# Patient Record
Sex: Male | Born: 1985 | Race: White | Hispanic: No | Marital: Married | Smoking: Current every day smoker
Health system: Southern US, Community
[De-identification: ages and names within clinical notes are randomized; demographics above are authoritative.]

---

## 2010-03-03 ENCOUNTER — Emergency Department (HOSPITAL_COMMUNITY): Admission: EM | Admit: 2010-03-03 | Discharge: 2010-03-03 | Payer: Self-pay | Admitting: Emergency Medicine

## 2010-08-14 ENCOUNTER — Emergency Department (HOSPITAL_COMMUNITY)
Admission: EM | Admit: 2010-08-14 | Discharge: 2010-08-14 | Disposition: A | Discharge status: Home / Self Care | Payer: Worker's Compensation | Attending: Emergency Medicine | Admitting: Emergency Medicine

## 2010-08-14 DIAGNOSIS — M79609 Pain in unspecified limb: Secondary | ICD-10-CM | POA: Insufficient documentation

## 2010-08-14 DIAGNOSIS — Y9289 Other specified places as the place of occurrence of the external cause: Secondary | ICD-10-CM | POA: Insufficient documentation

## 2010-08-14 DIAGNOSIS — X58XXXA Exposure to other specified factors, initial encounter: Secondary | ICD-10-CM | POA: Insufficient documentation

## 2010-08-14 DIAGNOSIS — S60559A Superficial foreign body of unspecified hand, initial encounter: Secondary | ICD-10-CM | POA: Insufficient documentation

## 2014-05-05 ENCOUNTER — Emergency Department (HOSPITAL_COMMUNITY): Payer: Worker's Compensation

## 2014-05-05 ENCOUNTER — Emergency Department (HOSPITAL_COMMUNITY)
Admission: EM | Admit: 2014-05-05 | Discharge: 2014-05-05 | Disposition: A | Discharge status: Home / Self Care | Payer: Worker's Compensation | Attending: Emergency Medicine | Admitting: Emergency Medicine

## 2014-05-05 DIAGNOSIS — M79605 Pain in left leg: Secondary | ICD-10-CM

## 2014-05-05 DIAGNOSIS — Y990 Civilian activity done for income or pay: Secondary | ICD-10-CM | POA: Insufficient documentation

## 2014-05-05 DIAGNOSIS — Y9389 Activity, other specified: Secondary | ICD-10-CM | POA: Diagnosis not present

## 2014-05-05 DIAGNOSIS — Y92410 Unspecified street and highway as the place of occurrence of the external cause: Secondary | ICD-10-CM | POA: Diagnosis not present

## 2014-05-05 DIAGNOSIS — S80212A Abrasion, left knee, initial encounter: Secondary | ICD-10-CM | POA: Insufficient documentation

## 2014-05-05 DIAGNOSIS — S6992XA Unspecified injury of left wrist, hand and finger(s), initial encounter: Secondary | ICD-10-CM | POA: Diagnosis present

## 2014-05-05 DIAGNOSIS — S79922A Unspecified injury of left thigh, initial encounter: Secondary | ICD-10-CM | POA: Diagnosis not present

## 2014-05-05 DIAGNOSIS — S80211A Abrasion, right knee, initial encounter: Secondary | ICD-10-CM | POA: Diagnosis not present

## 2014-05-05 DIAGNOSIS — M795 Residual foreign body in soft tissue: Secondary | ICD-10-CM

## 2014-05-05 MED ORDER — IBUPROFEN 800 MG PO TABS: 800.0000 mg | ORAL_TABLET | Freq: Three times a day (TID) | ORAL | Status: AC

## 2014-05-05 NOTE — ED Notes (Signed)
Patient is alert and orientedx4.  Patient was explained discharge instructions and they understood them with no questions.   
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
## 2014-05-05 NOTE — ED Notes (Signed)
Patient was triaged and assessed during down time, see paper charting.
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
## 2016-10-10 ENCOUNTER — Encounter (HOSPITAL_COMMUNITY): Payer: Self-pay | Admitting: Emergency Medicine

## 2016-10-10 ENCOUNTER — Emergency Department (HOSPITAL_COMMUNITY): Payer: Worker's Compensation

## 2016-10-10 ENCOUNTER — Emergency Department (HOSPITAL_COMMUNITY)
Admission: EM | Admit: 2016-10-10 | Discharge: 2016-10-10 | Disposition: A | Discharge status: Home / Self Care | Payer: Worker's Compensation | Attending: Emergency Medicine | Admitting: Emergency Medicine

## 2016-10-10 DIAGNOSIS — F17200 Nicotine dependence, unspecified, uncomplicated: Secondary | ICD-10-CM | POA: Diagnosis not present

## 2016-10-10 DIAGNOSIS — W230XXA Caught, crushed, jammed, or pinched between moving objects, initial encounter: Secondary | ICD-10-CM | POA: Insufficient documentation

## 2016-10-10 DIAGNOSIS — Y990 Civilian activity done for income or pay: Secondary | ICD-10-CM | POA: Diagnosis not present

## 2016-10-10 DIAGNOSIS — Y939 Activity, unspecified: Secondary | ICD-10-CM | POA: Diagnosis not present

## 2016-10-10 DIAGNOSIS — Y929 Unspecified place or not applicable: Secondary | ICD-10-CM | POA: Insufficient documentation

## 2016-10-10 DIAGNOSIS — S62337A Displaced fracture of neck of fifth metacarpal bone, left hand, initial encounter for closed fracture: Secondary | ICD-10-CM | POA: Insufficient documentation

## 2016-10-10 DIAGNOSIS — S6982XA Other specified injuries of left wrist, hand and finger(s), initial encounter: Secondary | ICD-10-CM | POA: Diagnosis present

## 2016-10-10 MED ORDER — HYDROCODONE-ACETAMINOPHEN 5-325 MG PO TABS: 1.0000 | ORAL_TABLET | Freq: Four times a day (QID) | ORAL | 0 refills | Status: AC | PRN

## 2016-10-10 NOTE — ED Notes (Signed)
Patient declined prescription

## 2016-10-10 NOTE — ED Provider Notes (Signed)
[facility]-EMERGENCY DEPT Provider Note   CSN: [id number] Arrival date & time: 10/10/16  0146  By signing my name below, I, Dustin Dunn, attest that this documentation has been prepared under the direction and in the presence of Jesten Cappuccio, Mayer Masker, MD. Electronically Signed: Karren Cobble, ED Scribe. 10/10/16. 4:07 AM.  History   Chief Complaint Chief Complaint  Patient presents with  . Hand Injury   The history is provided by the patient. No language interpreter was used.    HPI Comments: Dustin Dunn is a 31 y.o. male with no pertinent history who presents to the Emergency Department complaining of left hand injury that occurred prior to arrival, he rates the severity of his pain a 5/10. Pt notes associated swelling and pain in the 5th digit that radiates into the forearm with movement. He was at work when he hit his left hand and immediately felt pain. No treatment tried prior to arrival.   History reviewed. No pertinent past medical history.  There are no active problems to display for this patient.  History reviewed. No pertinent surgical history.  Home Medications    Prior to Admission medications   Medication Sig Start Date End Date Taking? Authorizing Provider  HYDROcodone-acetaminophen (NORCO/VICODIN) 5-325 MG tablet Take 1 tablet by mouth every 6 (six) hours as needed. 10/10/16   Jahshua Bonito, Mayer Masker, MD  ibuprofen (ADVIL,MOTRIN) 800 MG tablet Take 1 tablet (800 mg total) by mouth 3 (three) times daily. 05/05/14   Elwin Mocha, MD   Family History No family history on file.  Social History Social History  Substance Use Topics  . Smoking status: Current Every Day Smoker  . Smokeless tobacco: Never Used  . Alcohol use Not on file   Allergies   Patient has no known allergies.  Review of Systems Review of Systems  Musculoskeletal:       Hand pain  Skin: Negative for wound.  Neurological: Negative for weakness and numbness.  All other systems reviewed and are  negative.   Physical Exam Updated Vital Signs BP (!) 138/103 (BP Location: Right Arm)   Pulse 75   Temp 98.5 F (36.9 C) (Oral)   Resp 18   SpO2 99%   Physical Exam  Constitutional: He is oriented to person, place, and time. He appears well-developed and well-nourished. No distress.  HENT:  Head: Normocephalic and atraumatic.  Cardiovascular: Normal rate and regular rhythm.   Pulmonary/Chest: Effort normal. No respiratory distress.  Musculoskeletal: He exhibits no edema.  Focused examination of the left hand reveals swelling over the ulnar and dorsal aspect of the fifth metacarpal, tenderness to palpation, no obvious deformities, normal range of motion at the MCP PIP and DIP joints, 2+ radial pulse  Neurological: He is alert and oriented to person, place, and time.  Skin: Skin is warm and dry.  Psychiatric: He has a normal mood and affect.  Nursing note and vitals reviewed.    ED Treatments / Results  DIAGNOSTIC STUDIES: Oxygen Saturation is 99% on RA, normal by my interpretation.   COORDINATION OF CARE: 3:46 AM-Discussed next steps with pt. Pt verbalized understanding and is agreeable with the plan.   Labs (all labs ordered are listed, but only abnormal results are displayed) Labs Reviewed - No data to display  EKG  EKG Interpretation None       Radiology Dg Hand Complete Left  Result Date: 10/10/2016 CLINICAL DATA:  Left hand pain and swelling. Injury with hand smashed between tractor and piece of equipment.  Pain in the fourth and fifth metacarpal area. EXAM: LEFT HAND - COMPLETE 3+ VIEW COMPARISON:  Left hand radiograph 05/05/2014 FINDINGS: Mildly displaced comminuted fracture of the distal fifth metacarpal with mild volar displacement of distal fragments. Possible intra-articular extension of the metacarpal phalangeal joint. No additional acute fracture of the hand. There is mild associated soft tissue edema. IMPRESSION: Mildly displaced fifth metacarpal  fracture, with possible extension to the metacarpal phalangeal joint Electronically Signed   By: Rubye OaksMelanie  Ehinger M.D.   On: 10/10/2016 02:41    Procedures .Splint Application Date/Time: 10/10/2016 4:59 AM Performed by: Shon BatonHORTON, Daxtyn Rottenberg F Authorized by: Shon BatonHORTON, Calob Baskette F   Consent:    Consent obtained:  Verbal   Consent given by:  Patient   Alternatives discussed:  No treatment Pre-procedure details:    Sensation:  Normal Procedure details:    Laterality:  Left   Location:  Hand   Hand:  L hand   Strapping: no     Cast type:  Short arm   Splint type:  Ulnar gutter   Supplies:  Prefabricated splint Post-procedure details:    Patient tolerance of procedure:  Tolerated well, no immediate complications   (including critical care time)  Medications Ordered in ED Medications - No data to display   Initial Impression / Assessment and Plan / ED Course  I have reviewed the triage vital signs and the nursing notes.  Pertinent labs & imaging results that were available during my care of the patient were reviewed by me and considered in my medical decision making (see chart for details).  Clinical Course as of Oct 11 455  Wed Oct 10, 2016  0339 DG Hand Complete Left [NL]    Clinical Course User Index [NL] Theotis BarrioLewis, [place]    Patient presents with pain to the left hand after being crushed under a machine at work. He has evidence of a distal metacarpal fracture. He is neurovascularly intact. Normal range of motion. Patient was splinted. Recommend hand follow-up. Elevation and ice as needed.  After history, exam, and medical workup I feel the patient has been appropriately medically screened and is safe for discharge home. Pertinent diagnoses were discussed with the patient. Patient was given return precautions.   Final Clinical Impressions(s) / ED Diagnoses   Final diagnoses:  Closed displaced fracture of neck of fifth metacarpal bone of left hand, initial encounter    New  Prescriptions Discharge Medication List as of 10/10/2016  3:59 AM    START taking these medications   Details  HYDROcodone-acetaminophen (NORCO/VICODIN) 5-325 MG tablet Take 1 tablet by mouth every 6 (six) hours as needed., Starting Wed 10/10/2016, Print         Jaylon Boylen, Mayer Maskerourtney F, MD 10/10/16 0500

## 2016-10-10 NOTE — ED Triage Notes (Signed)
Patient was at work today, hit his left hand between a dolly and it hit his left posterior side of hand.  Swelling noted to area under left pinky.

## 2016-10-10 NOTE — ED Notes (Signed)
Ortho notified

## 2016-10-10 NOTE — Discharge Instructions (Signed)
Keep iced and elevated. Follow-up with hand surgery.

## 2018-04-23 IMAGING — CR DG HAND COMPLETE 3+V*L*
3 series · 3 of 3 positions shown · non-contrast
Comparison: Left hand radiograph 05/05/2014

CLINICAL DATA: Left hand pain and swelling. Injury with hand
smashed between tractor and piece of equipment. Pain in the fourth
and fifth metacarpal area.

EXAM:
LEFT HAND - COMPLETE 3+ VIEW

[hand pa]
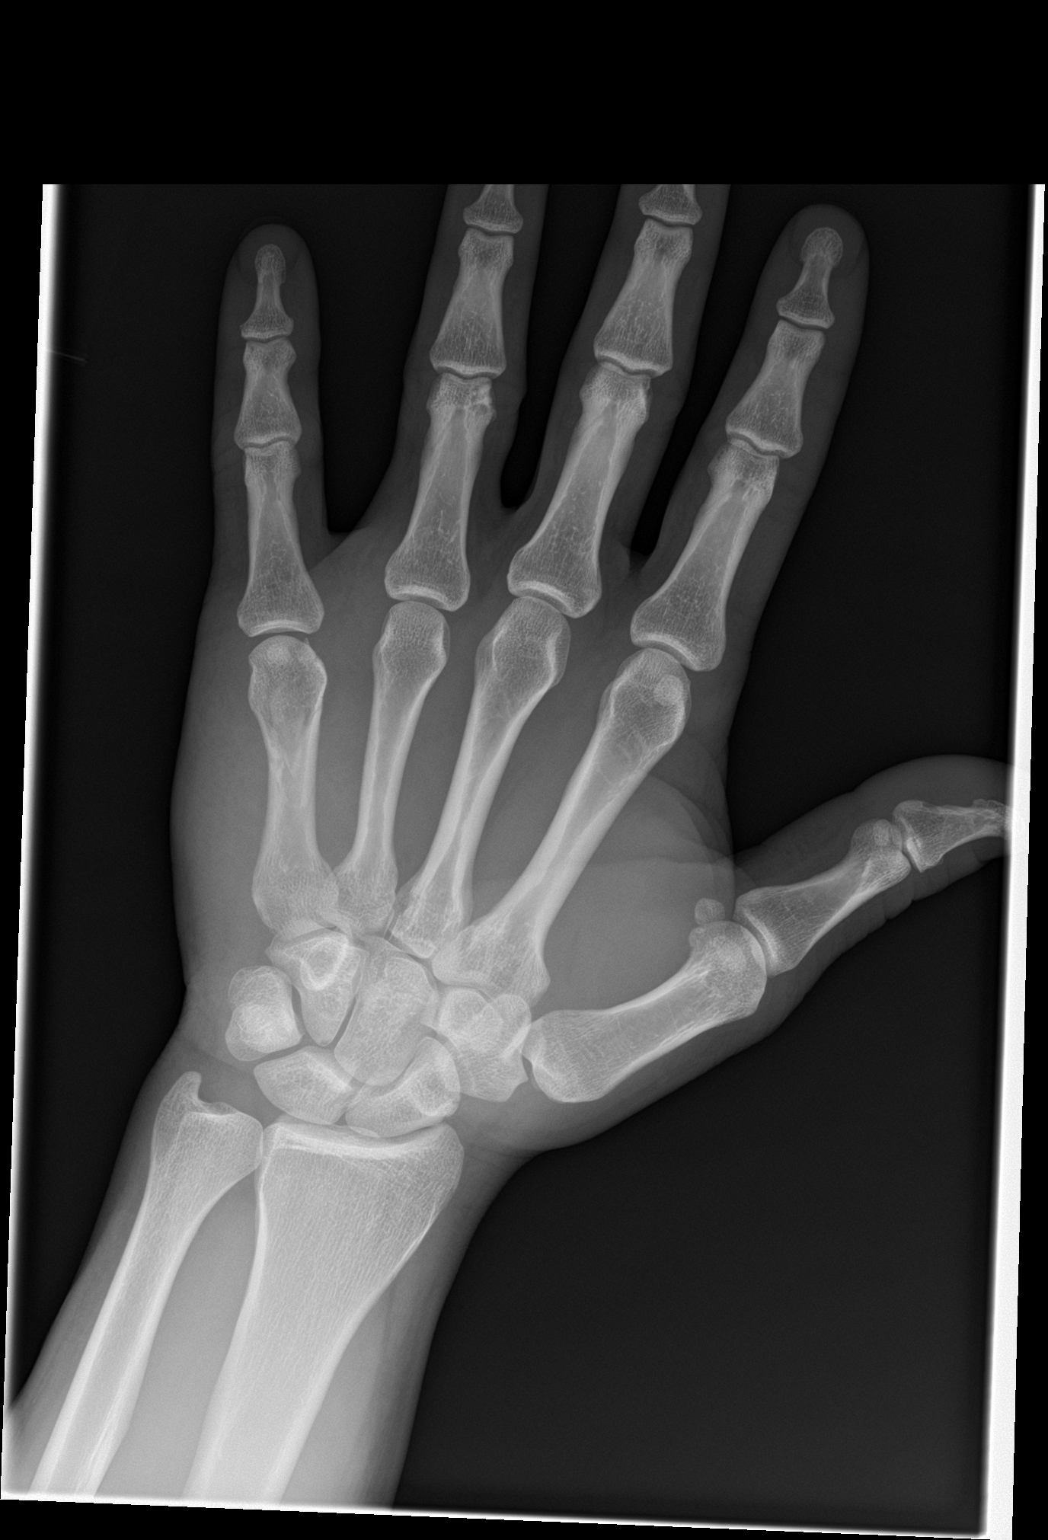

[hand obl]
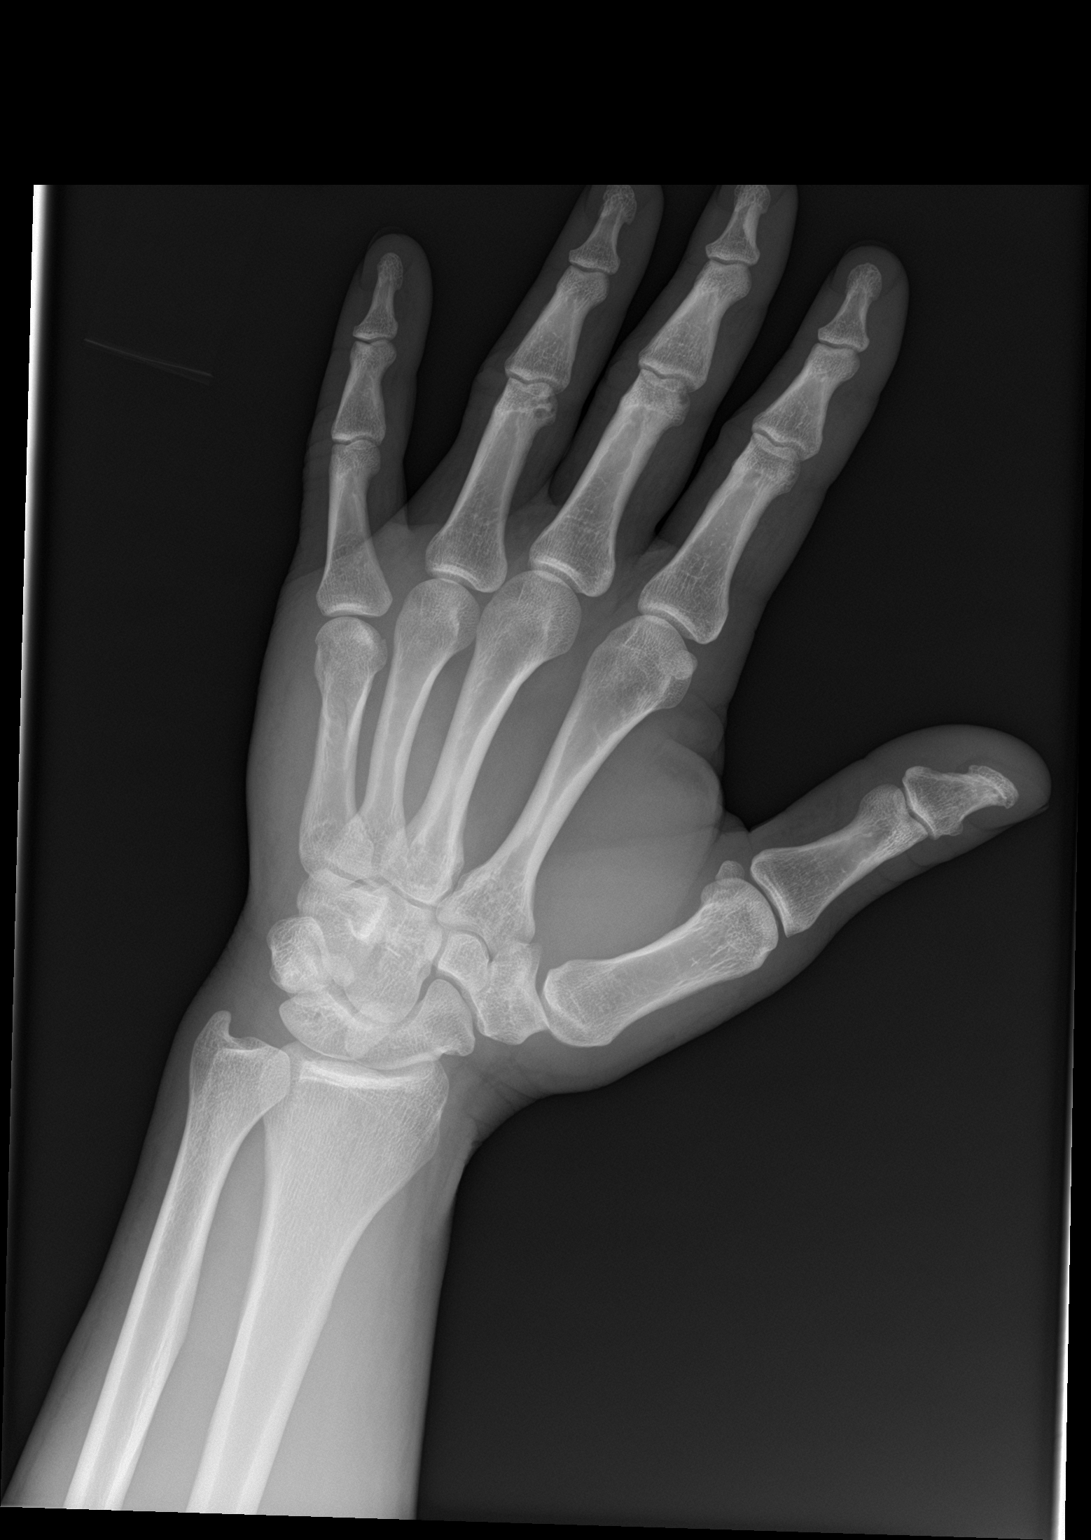

[hand lat]
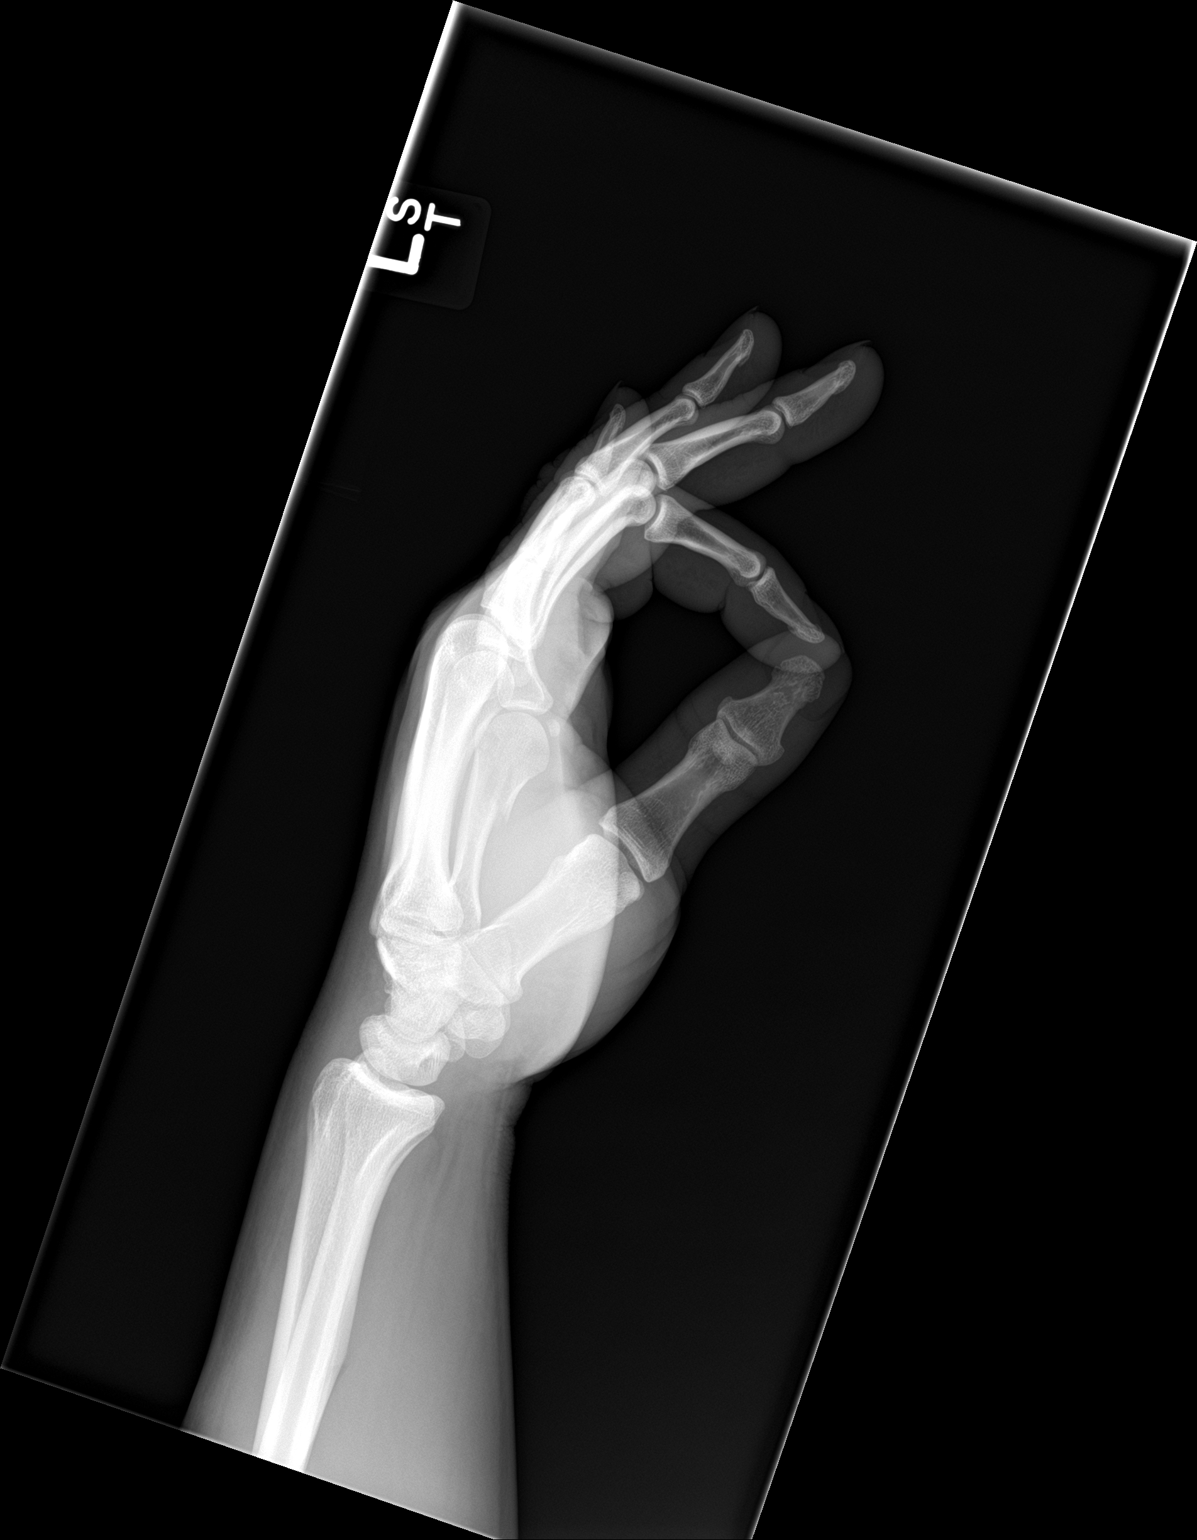

[3 of 3 positions shown; findings below may reference images not displayed]

FINDINGS: Mildly displaced comminuted fracture of the distal fifth metacarpal
with mild volar displacement of distal fragments. Possible
intra-articular extension of the metacarpal phalangeal joint. No
additional acute fracture of the hand. There is mild associated soft
tissue edema.
IMPRESSION: Mildly displaced fifth metacarpal fracture, with possible extension
to the metacarpal phalangeal joint

## 2020-04-20 ENCOUNTER — Other Ambulatory Visit: Payer: Self-pay

## 2020-04-20 ENCOUNTER — Other Ambulatory Visit (HOSPITAL_COMMUNITY): Payer: Self-pay | Admitting: Physician Assistant

## 2020-04-20 ENCOUNTER — Ambulatory Visit (HOSPITAL_COMMUNITY)
Admission: RE | Admit: 2020-04-20 | Discharge: 2020-04-20 | Disposition: A | Discharge status: Home / Self Care | Payer: Managed Care, Other (non HMO) | Source: Ambulatory Visit | Attending: Physician Assistant | Admitting: Physician Assistant

## 2020-04-20 DIAGNOSIS — R053 Chronic cough: Secondary | ICD-10-CM | POA: Insufficient documentation

## 2020-12-21 ENCOUNTER — Encounter (HOSPITAL_COMMUNITY): Payer: Self-pay

## 2020-12-21 ENCOUNTER — Emergency Department (HOSPITAL_COMMUNITY)
Admission: EM | Admit: 2020-12-21 | Discharge: 2020-12-21 | Disposition: A | Discharge status: Home / Self Care | Payer: No Typology Code available for payment source | Attending: Emergency Medicine | Admitting: Emergency Medicine

## 2020-12-21 ENCOUNTER — Other Ambulatory Visit: Payer: Self-pay

## 2020-12-21 DIAGNOSIS — F17200 Nicotine dependence, unspecified, uncomplicated: Secondary | ICD-10-CM | POA: Diagnosis not present

## 2020-12-21 DIAGNOSIS — H5789 Other specified disorders of eye and adnexa: Secondary | ICD-10-CM

## 2020-12-21 DIAGNOSIS — H5711 Ocular pain, right eye: Secondary | ICD-10-CM | POA: Insufficient documentation

## 2020-12-21 MED ORDER — FLUORESCEIN SODIUM 1 MG OP STRP
1.0000 | ORAL_STRIP | Freq: Once | OPHTHALMIC | Status: AC
  Administered 2020-12-21: 1 via OPHTHALMIC
  Filled 2020-12-21: qty 1

## 2020-12-21 MED ORDER — PROPARACAINE HCL 0.5 % OP SOLN
1.0000 [drp] | Freq: Once | OPHTHALMIC | Status: AC
  Administered 2020-12-21: 1 [drp] via OPHTHALMIC
  Filled 2020-12-21: qty 15

## 2020-12-21 NOTE — Discharge Instructions (Addendum)
Your exam was assuring today.  You do not have any foreign bodies in your eye.  The lamp examination showed no sign of abrasion.  You do not have any ulceration.  You may use these eyedrops 3 times a day-no more than 2 drops each time.  Do not use these drops for more than 3 days.  Overuse can be very dangerous and result and permanent eye damage and vision loss.  Return to the emergency department if you begin to have more intense pain, visual changes or difficulty moving your eye.  Follow-up with the eye doctor attached to the papers as .needed

## 2020-12-21 NOTE — ED Provider Notes (Signed)
[facility]-EMERGENCY DEPT Provider Note   CSN: [id number] Arrival date & time: 12/21/20  1614     History Chief Complaint  Patient presents with   Foreign Body in Eye    Dustin Dunn is a 35 y.o. male resenting with a complaint of right eye pain.  Patient was working under a truck when he felt as though he may have gotten rust in his eye.  He tried to flush his eyes out at the workstation eyewash without change in symptoms.  Years ago he had chemical damage to his eyes that required him to start wearing eyeglasses.  Never worn contact lenses.  No pain with movement of the eye.  Pain with blinking of the eye.   Foreign Body in Eye Pertinent negatives include no headaches.      History reviewed. No pertinent past medical history.  There are no problems to display for this patient.   History reviewed. No pertinent surgical history.     History reviewed. No pertinent family history.  Social History   Tobacco Use   Smoking status: Every Day   Smokeless tobacco: Never    Home Medications Prior to Admission medications   Medication Sig Start Date End Date Taking? Authorizing Provider  HYDROcodone-acetaminophen (NORCO/VICODIN) 5-325 MG tablet Take 1 tablet by mouth every 6 (six) hours as needed. 10/10/16   Horton, Mayer Masker, MD  ibuprofen (ADVIL,MOTRIN) 800 MG tablet Take 1 tablet (800 mg total) by mouth 3 (three) times daily. 05/05/14   Elwin Mocha, MD    Allergies    Patient has no known allergies.  Review of Systems   Review of Systems  HENT:  Negative for ear pain.   Eyes:  Positive for pain and redness. Negative for discharge and itching.  Skin:  Negative for wound.  Neurological:  Negative for dizziness and headaches.  All other systems reviewed and are negative.  Physical Exam Updated Vital Signs BP (!) 134/93 (BP Location: Right Arm)   Pulse 83   Temp 98.2 F (36.8 C) (Oral)   Resp 16   SpO2 100%   Physical Exam Vitals and  nursing note reviewed.  Constitutional:      General: He is not in acute distress.    Appearance: Normal appearance.  HENT:     Head: Normocephalic and atraumatic.  Eyes:     General: No scleral icterus.    Extraocular Movements: Extraocular movements intact.     Conjunctiva/sclera: Conjunctivae normal.     Comments: Right eye swollen however no foreign body visualized.  After lidocaine administration I was still unable to note any foreign body.  Fluorescein exam negative for abrasion or ulceration.  Lidocaine did improve the patient's symptoms.  No problems with his left eye.  Pulmonary:     Effort: Pulmonary effort is normal. No respiratory distress.  Musculoskeletal:     Cervical back: Normal range of motion and neck supple.  Skin:    General: Skin is warm and dry.     Findings: No rash.  Neurological:     Mental Status: He is alert.     Cranial Nerves: No cranial nerve deficit.  Psychiatric:        Mood and Affect: Mood normal.        Behavior: Behavior normal.    ED Results / Procedures / Treatments   Labs (all labs ordered are listed, but only abnormal results are displayed) Labs Reviewed - No data to display  EKG None  Radiology No results found.  Procedures Procedures   Medications Ordered in ED Medications  fluorescein ophthalmic strip 1 strip (has no administration in time range)  proparacaine (ALCAINE) 0.5 % ophthalmic solution 1 drop (has no administration in time range)    ED Course  I have reviewed the triage vital signs and the nursing notes.  Pertinent labs & imaging results that were available during my care of the patient were reviewed by me and considered in my medical decision making (see chart for details).    MDM Rules/Calculators/A&P 35 year old male presenting with a complaint of right eye pain after working under a truck at work.  While he was working he felt as though he got some rust in his eye and tried to rinse his eyes out twice  without relief.   On physical exam was unable to note any foreign body.  Patient with EOMs intact.  I utilized a fluorescein stain and Woods lamp to assess for corneal abrasion and corneal ulceration and no abrasion or ulceration was noted.  Patient with relief from the lidocaine drops. Patient with mild discomfort today in the emergency department.  PERRLA without photophobia.  I have low suspicion for iritis due to the patient's negative photophobia.  I suspect the patient to have mild eye irritation from a small foreign body that has since been washed out or from the eyewash itself.  I will discharge the patient with Alcaine drops however he will be educated on the importance of not using the drops more than 3 times daily due to the risk of eye damage.  I will also give the patient an eye doctor to follow-up with in the event that his symptoms do not get better.  Return precautions were discussed with the patient and attached to discharge papers.  Final Clinical Impression(s) / ED Diagnoses Final diagnoses:  Eye irritation    Rx / DC Orders Results and diagnoses were explained to the patient. Return precautions discussed in full. Patient had no additional questions and expressed complete understanding.     Woodroe Chen 12/21/20 1714    Cheryll Cockayne, MD 12/26/20 [phone]

## 2020-12-21 NOTE — ED Triage Notes (Signed)
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

## 2020-12-21 NOTE — ED Notes (Signed)
An After Visit Summary was printed and given to the patient. Discharge instructions given and no further questions at this time.  

## 2021-11-01 IMAGING — DX DG CHEST 2V
2 series · 2 of 2 positions shown · non-contrast
Comparison: None.

CLINICAL DATA: Chronic cough.

EXAM:
CHEST - 2 VIEW

[chest pa]
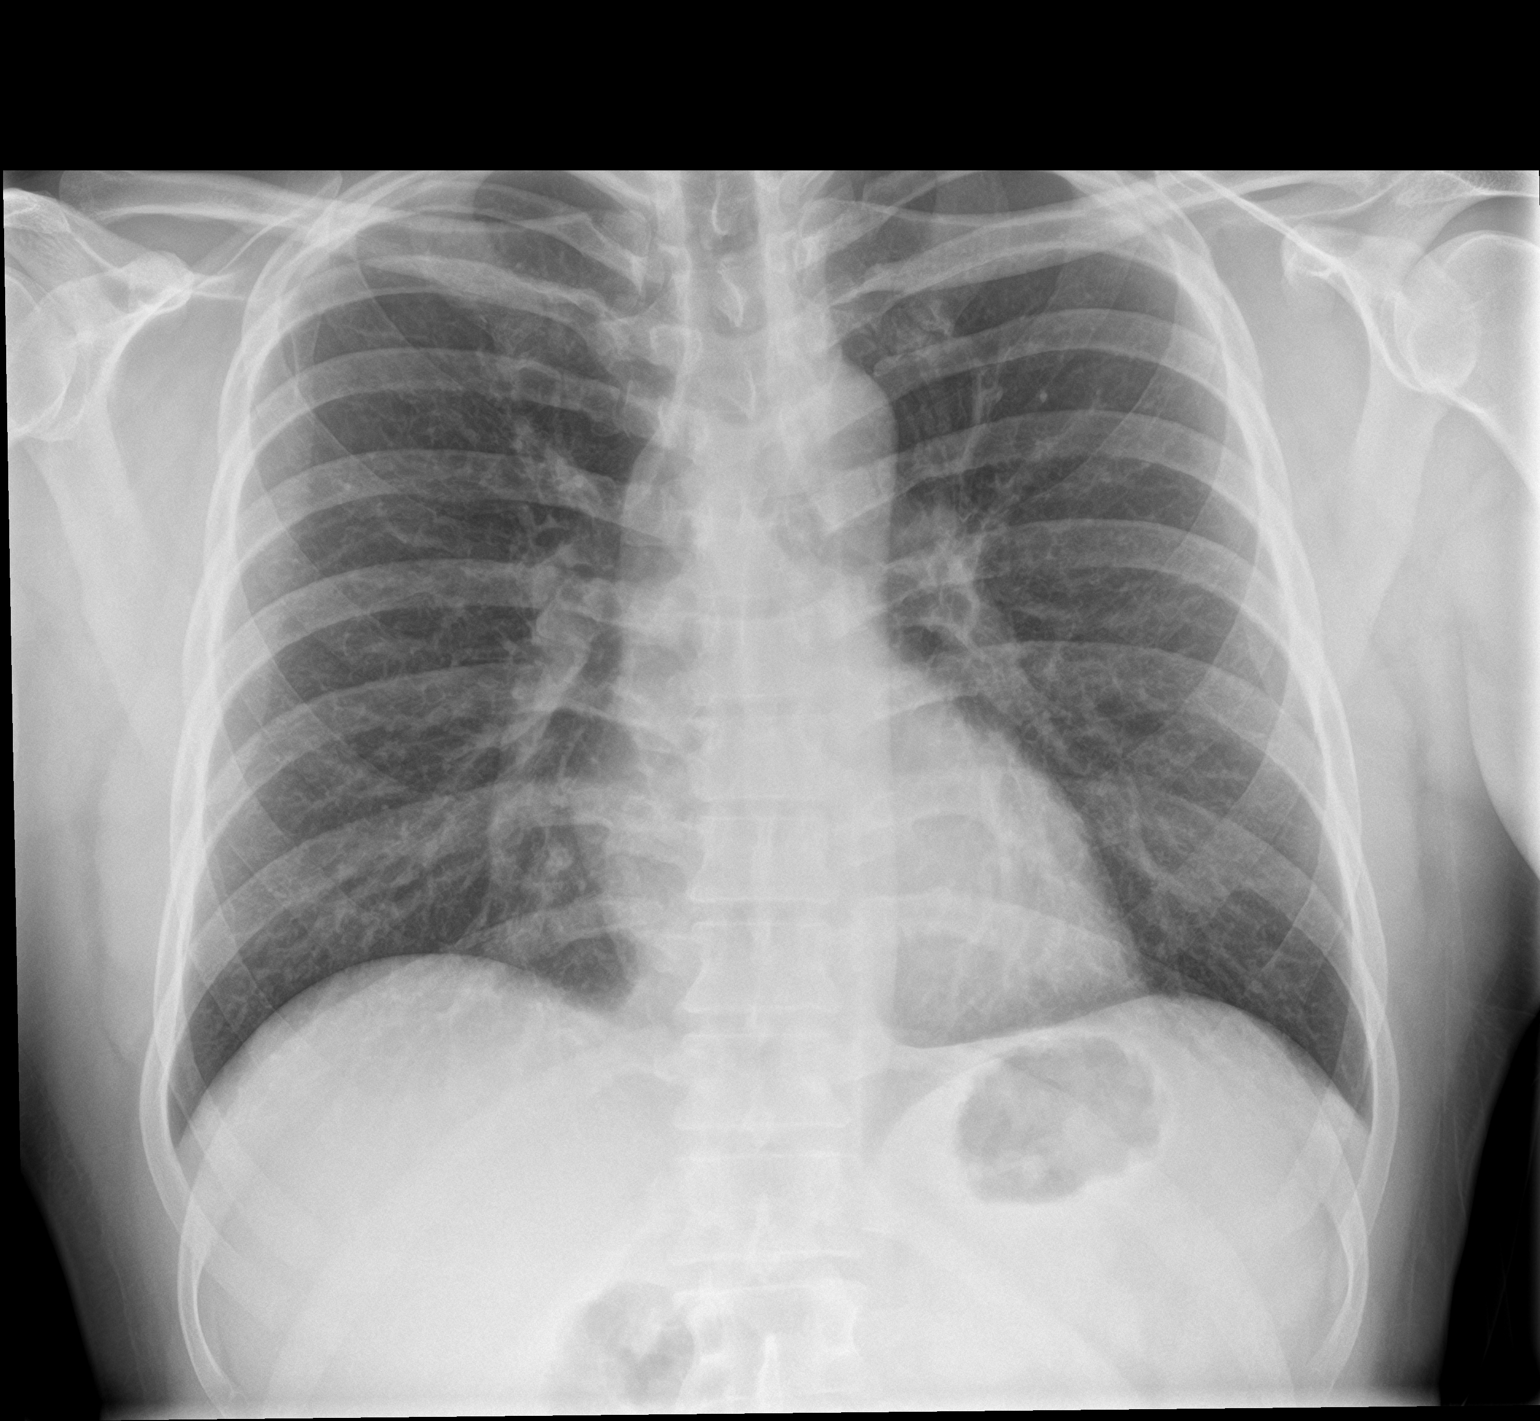

[chest lat]
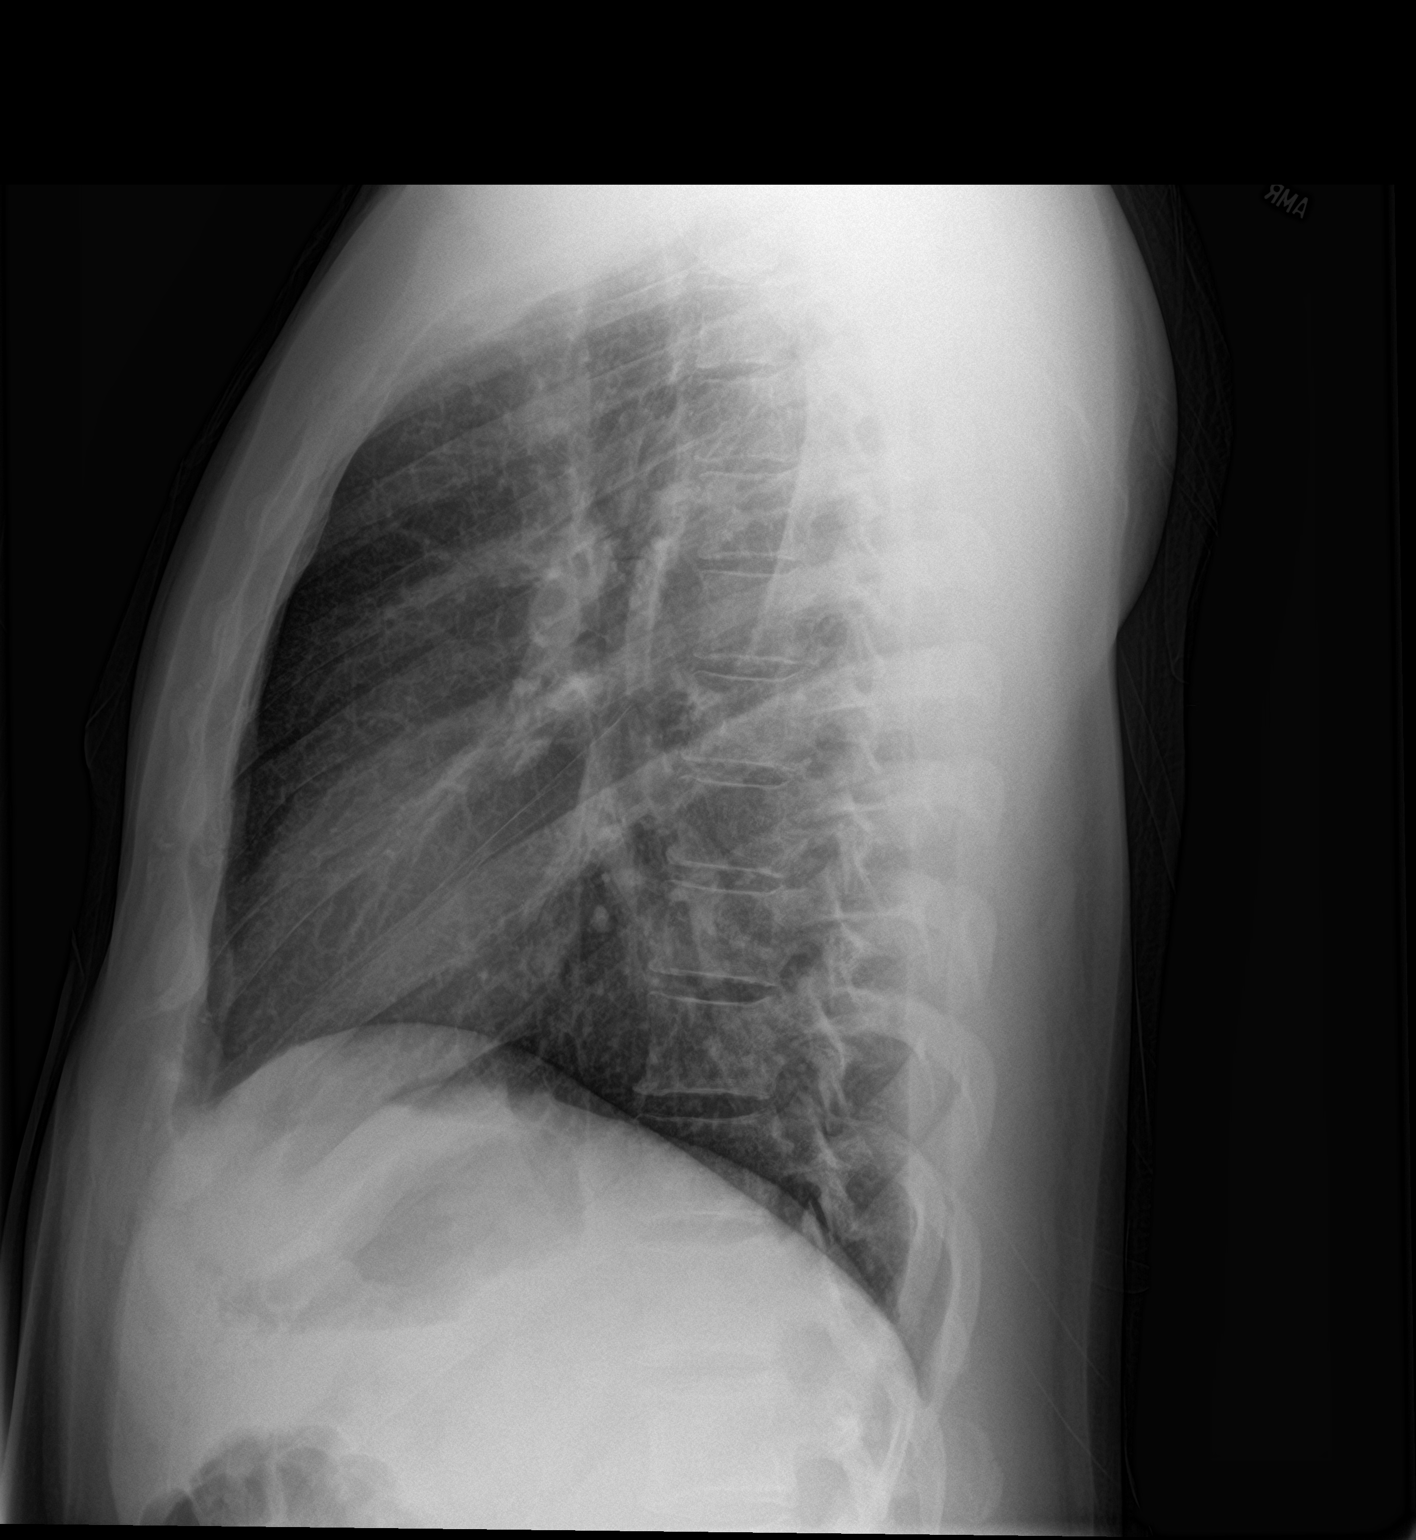

[2 of 2 positions shown; findings below may reference images not displayed]

FINDINGS: The cardiac silhouette, mediastinal and hilar contours are within
normal limits. The lungs are clear of an acute process. No pleural
effusions or pulmonary lesions. The bony thorax is intact.
IMPRESSION: No acute cardiopulmonary findings.
